# Patient Record
Sex: Female | Born: 1950
Health system: Southern US, Community
[De-identification: ages and names within clinical notes are randomized; demographics above are authoritative.]

---

## 1998-04-17 ENCOUNTER — Ambulatory Visit (HOSPITAL_BASED_OUTPATIENT_CLINIC_OR_DEPARTMENT_OTHER): Admission: RE | Admit: 1998-04-17 | Discharge: 1998-04-17 | Payer: Self-pay | Admitting: Orthopedic Surgery

## 2006-02-11 ENCOUNTER — Encounter: Admission: RE | Admit: 2006-02-11 | Discharge: 2006-02-11 | Payer: Self-pay | Admitting: Internal Medicine

## 2006-02-25 ENCOUNTER — Other Ambulatory Visit: Admission: RE | Admit: 2006-02-25 | Discharge: 2006-02-25 | Payer: Self-pay | Admitting: Internal Medicine

## 2006-09-17 ENCOUNTER — Encounter: Admission: RE | Admit: 2006-09-17 | Discharge: 2006-09-17 | Payer: Self-pay | Admitting: Internal Medicine

## 2007-09-29 ENCOUNTER — Other Ambulatory Visit: Admission: RE | Admit: 2007-09-29 | Discharge: 2007-09-29 | Payer: Self-pay | Admitting: Internal Medicine

## 2007-10-28 ENCOUNTER — Encounter: Admission: RE | Admit: 2007-10-28 | Discharge: 2007-10-28 | Payer: Self-pay | Admitting: Internal Medicine

## 2008-10-16 ENCOUNTER — Other Ambulatory Visit: Admission: RE | Admit: 2008-10-16 | Discharge: 2008-10-16 | Payer: Self-pay | Admitting: Internal Medicine

## 2009-07-05 ENCOUNTER — Encounter: Admission: RE | Admit: 2009-07-05 | Discharge: 2009-07-05 | Payer: Self-pay | Admitting: Internal Medicine

## 2009-07-11 ENCOUNTER — Encounter: Admission: RE | Admit: 2009-07-11 | Discharge: 2009-07-11 | Payer: Self-pay | Admitting: Internal Medicine

## 2009-10-17 ENCOUNTER — Other Ambulatory Visit: Admission: RE | Admit: 2009-10-17 | Discharge: 2009-10-17 | Payer: Self-pay | Admitting: Internal Medicine

## 2010-10-22 ENCOUNTER — Other Ambulatory Visit
Admission: RE | Admit: 2010-10-22 | Discharge: 2010-10-22 | Payer: Self-pay | Source: Home / Self Care | Admitting: Internal Medicine

## 2011-10-14 ENCOUNTER — Other Ambulatory Visit: Payer: Self-pay | Admitting: Internal Medicine

## 2011-10-14 DIAGNOSIS — Z1231 Encounter for screening mammogram for malignant neoplasm of breast: Secondary | ICD-10-CM

## 2011-10-27 ENCOUNTER — Ambulatory Visit
Admission: RE | Admit: 2011-10-27 | Discharge: 2011-10-27 | Disposition: A | Discharge status: Home / Self Care | Payer: BC Managed Care – PPO | Source: Ambulatory Visit | Attending: Internal Medicine | Admitting: Internal Medicine

## 2011-10-27 DIAGNOSIS — Z1231 Encounter for screening mammogram for malignant neoplasm of breast: Secondary | ICD-10-CM

## 2012-03-01 ENCOUNTER — Other Ambulatory Visit: Payer: Self-pay | Admitting: Dermatology

## 2012-05-30 ENCOUNTER — Other Ambulatory Visit: Payer: Self-pay | Admitting: Dermatology

## 2013-11-06 ENCOUNTER — Other Ambulatory Visit: Payer: Self-pay

## 2013-11-06 DIAGNOSIS — Z1231 Encounter for screening mammogram for malignant neoplasm of breast: Secondary | ICD-10-CM

## 2013-11-21 ENCOUNTER — Other Ambulatory Visit: Payer: Self-pay | Admitting: Internal Medicine

## 2013-11-21 ENCOUNTER — Other Ambulatory Visit (HOSPITAL_COMMUNITY)
Admission: RE | Admit: 2013-11-21 | Discharge: 2013-11-21 | Disposition: A | Discharge status: Home / Self Care | Payer: 59 | Source: Ambulatory Visit | Attending: Internal Medicine | Admitting: Internal Medicine

## 2013-11-21 DIAGNOSIS — Z01419 Encounter for gynecological examination (general) (routine) without abnormal findings: Secondary | ICD-10-CM | POA: Insufficient documentation

## 2013-11-21 DIAGNOSIS — Z1151 Encounter for screening for human papillomavirus (HPV): Secondary | ICD-10-CM | POA: Insufficient documentation

## 2013-12-05 ENCOUNTER — Ambulatory Visit: Payer: BC Managed Care – PPO

## 2013-12-14 ENCOUNTER — Ambulatory Visit
Admission: RE | Admit: 2013-12-14 | Discharge: 2013-12-14 | Disposition: A | Discharge status: Home / Self Care | Payer: 59 | Source: Ambulatory Visit

## 2013-12-14 DIAGNOSIS — Z1231 Encounter for screening mammogram for malignant neoplasm of breast: Secondary | ICD-10-CM

## 2015-12-26 ENCOUNTER — Other Ambulatory Visit: Payer: Self-pay

## 2015-12-26 DIAGNOSIS — Z1231 Encounter for screening mammogram for malignant neoplasm of breast: Secondary | ICD-10-CM

## 2016-01-21 ENCOUNTER — Ambulatory Visit
Admission: RE | Admit: 2016-01-21 | Discharge: 2016-01-21 | Disposition: A | Discharge status: Home / Self Care | Payer: 59 | Source: Ambulatory Visit

## 2016-01-21 DIAGNOSIS — Z1231 Encounter for screening mammogram for malignant neoplasm of breast: Secondary | ICD-10-CM

## 2016-11-12 DIAGNOSIS — R748 Abnormal levels of other serum enzymes: Secondary | ICD-10-CM | POA: Diagnosis not present

## 2016-11-12 DIAGNOSIS — I10 Essential (primary) hypertension: Secondary | ICD-10-CM | POA: Diagnosis not present

## 2016-11-12 DIAGNOSIS — M8580 Other specified disorders of bone density and structure, unspecified site: Secondary | ICD-10-CM | POA: Diagnosis not present

## 2016-11-27 DIAGNOSIS — R748 Abnormal levels of other serum enzymes: Secondary | ICD-10-CM | POA: Diagnosis not present

## 2017-02-27 ENCOUNTER — Ambulatory Visit (INDEPENDENT_AMBULATORY_CARE_PROVIDER_SITE_OTHER): Payer: PPO | Admitting: Family Medicine

## 2017-02-27 VITALS — BP 166/88 | HR 86 | Temp 99.3°F | Resp 16 | Wt 116.8 lb

## 2017-02-27 DIAGNOSIS — S61552A Open bite of left wrist, initial encounter: Secondary | ICD-10-CM

## 2017-02-27 DIAGNOSIS — S61452A Open bite of left hand, initial encounter: Secondary | ICD-10-CM

## 2017-02-27 DIAGNOSIS — W5501XA Bitten by cat, initial encounter: Secondary | ICD-10-CM | POA: Diagnosis not present

## 2017-02-27 DIAGNOSIS — Z23 Encounter for immunization: Secondary | ICD-10-CM

## 2017-02-27 MED ORDER — AMOXICILLIN-POT CLAVULANATE 875-125 MG PO TABS: 1.0000 | ORAL_TABLET | Freq: Two times a day (BID) | ORAL | 0 refills | Status: AC

## 2017-02-27 NOTE — Progress Notes (Signed)
All wounds cleaned with soap and water in office

## 2017-02-27 NOTE — Patient Instructions (Addendum)
Keep wounds clean and covered until they are well questions or healed. Be especially cautious with changing the dressing on the place on your thumb. The oral antibiotics are the important things, and use of topical antibiotic ointments is optional depending on whether they are looking inflamed or are sticking to the bandage too much.  Take Augmentin 875 one twice daily with breakfast and supper. Take first pill now.  If red streaks are going up the arm, significant redness is developing, fevers, pain, or swollen glands in the armpit, problem please come back for a recheck or go to the emergency room if necessary.    IF you received an x-ray today, you will receive an invoice from [facility]. Please contact [facility] at [phone] with questions or concerns regarding your invoice.   IF you received labwork today, you will receive an invoice from [facility]. Please contact [facility] at [phone] with questions or concerns regarding your invoice.   Our billing staff will not be able to assist you with questions regarding bills from these companies.  You will be contacted with the lab results as soon as they are available. The fastest way to get your results is to activate your My Chart account. Instructions are located on the last page of this paperwork. If you have not heard from [place] regarding the results in 2 weeks, please contact this office.

## 2017-02-27 NOTE — Progress Notes (Signed)
Patient ID: TILLA WILBORN, female    DOB: March 15, 1951  Age: 66 y.o. MRN: [id number]  Chief Complaint  Patient presents with  . Animal Bite    today neighbors cate/multi scratch and bite to arms,left thumb and leg    Subjective:  Pleasant lady 41 years old who was helping take care of her neighbor's cat while the labor was out of town. The cat got out the door and the patient picked it up to cause it back in the house, with which the cat scratched and bit her.   Current allergies, medications, problem list, past/family and social histories reviewed.  Objective:  BP (!) 166/88 (Cuff Size: Normal)   Pulse 86   Temp 99.3 F (37.4 C) (Oral)   Resp 16   Wt 116 lb 12.8 oz (53 kg)   SpO2 98%  Most of the day injuries are to the left arm. She does have a small place on her right wrist and on the left leg there is also a scratch. The forearm has scratches and a couple of apparent tooth marks. The left index finger and thumb have tooth marks. These involve and washing her clean. The left thumb has a little flap of skin but appears to close well.    Assessment & Plan:   Assessment: 1. Cat bite of multiple sites of left hand and wrist, initial encounter       Plan: Wounds dressed.  Orders Placed This Encounter  Procedures  . Tdap vaccine greater than or equal to 7yo IM    Meds ordered this encounter  Medications  . olmesartan (BENICAR) 20 MG tablet    Sig: Take 20 mg by mouth daily.  Marland Kitchen amoxicillin-clavulanate (AUGMENTIN) 875-125 MG tablet    Sig: Take 1 tablet by mouth 2 (two) times daily.    Dispense:  20 tablet    Refill:  0         Patient Instructions   Keep wounds clean and covered until they are well questions or healed. Be especially cautious with changing the dressing on the place on your thumb. The oral antibiotics are the important things, and use of topical antibiotic ointments is optional depending on whether they are looking inflamed or are sticking to the  bandage too much.  Take Augmentin 875 one twice daily with breakfast and supper. Take first pill now.  If red streaks are going up the arm, significant redness is developing, fevers, pain, or swollen glands in the armpit, problem please come back for a recheck or go to the emergency room if necessary.    IF you received an x-ray today, you will receive an invoice from [facility]. Please contact [facility] at [phone] with questions or concerns regarding your invoice.   IF you received labwork today, you will receive an invoice from [facility]. Please contact [facility] at [phone] with questions or concerns regarding your invoice.   Our billing staff will not be able to assist you with questions regarding bills from these companies.  You will be contacted with the lab results as soon as they are available. The fastest way to get your results is to activate your My Chart account. Instructions are located on the last page of this paperwork. If you have not heard from [place] regarding the results in 2 weeks, please contact this office.        Return if symptoms worsen or fail to improve.   Zonnique Norkus, MD 02/27/2017

## 2017-03-01 ENCOUNTER — Ambulatory Visit: Payer: PPO | Admitting: Physician Assistant

## 2017-03-25 DIAGNOSIS — Z0000 Encounter for general adult medical examination without abnormal findings: Secondary | ICD-10-CM | POA: Diagnosis not present

## 2017-03-25 DIAGNOSIS — I10 Essential (primary) hypertension: Secondary | ICD-10-CM | POA: Diagnosis not present

## 2017-03-25 DIAGNOSIS — M8580 Other specified disorders of bone density and structure, unspecified site: Secondary | ICD-10-CM | POA: Diagnosis not present

## 2017-03-25 DIAGNOSIS — Z23 Encounter for immunization: Secondary | ICD-10-CM | POA: Diagnosis not present

## 2017-07-28 ENCOUNTER — Other Ambulatory Visit: Payer: Self-pay | Admitting: Pharmacist

## 2017-07-28 NOTE — Patient Outreach (Signed)
Incoming call from Sara Hanson in response to the [facility] Medication Adherence Campaign. Speak with patient. HIPAA identifiers verified and verbal consent received.  Ms. Rhines reports that she takes her olmesartan once daily as directed. Denies any missed doses or barriers to adherence. Reports that earlier in the year, prior to receiving the medication as a generic, she received several samples of Benicar and took that rather than having it refilled for cost savings.  Patient denies any medication questions/concerns at this time. Provide her with my phone number.  Harlow Asa, PharmD, [facility] [phone]

## 2017-11-19 DIAGNOSIS — Z85828 Personal history of other malignant neoplasm of skin: Secondary | ICD-10-CM | POA: Diagnosis not present

## 2017-11-19 DIAGNOSIS — D2272 Melanocytic nevi of left lower limb, including hip: Secondary | ICD-10-CM | POA: Diagnosis not present

## 2017-11-19 DIAGNOSIS — L570 Actinic keratosis: Secondary | ICD-10-CM | POA: Diagnosis not present

## 2017-11-19 DIAGNOSIS — L821 Other seborrheic keratosis: Secondary | ICD-10-CM | POA: Diagnosis not present

## 2018-08-25 DIAGNOSIS — Z23 Encounter for immunization: Secondary | ICD-10-CM | POA: Diagnosis not present

## 2018-08-25 DIAGNOSIS — R748 Abnormal levels of other serum enzymes: Secondary | ICD-10-CM | POA: Diagnosis not present

## 2018-08-25 DIAGNOSIS — I10 Essential (primary) hypertension: Secondary | ICD-10-CM | POA: Diagnosis not present

## 2018-09-02 DIAGNOSIS — R748 Abnormal levels of other serum enzymes: Secondary | ICD-10-CM | POA: Diagnosis not present

## 2018-11-17 DIAGNOSIS — F419 Anxiety disorder, unspecified: Secondary | ICD-10-CM | POA: Diagnosis not present

## 2018-11-17 DIAGNOSIS — F332 Major depressive disorder, recurrent severe without psychotic features: Secondary | ICD-10-CM | POA: Diagnosis not present

## 2019-01-31 DIAGNOSIS — M8580 Other specified disorders of bone density and structure, unspecified site: Secondary | ICD-10-CM | POA: Diagnosis not present

## 2019-01-31 DIAGNOSIS — Z0000 Encounter for general adult medical examination without abnormal findings: Secondary | ICD-10-CM | POA: Diagnosis not present

## 2019-01-31 DIAGNOSIS — Z1389 Encounter for screening for other disorder: Secondary | ICD-10-CM | POA: Diagnosis not present

## 2019-01-31 DIAGNOSIS — I10 Essential (primary) hypertension: Secondary | ICD-10-CM | POA: Diagnosis not present

## 2019-01-31 DIAGNOSIS — N183 Chronic kidney disease, stage 3 (moderate): Secondary | ICD-10-CM | POA: Diagnosis not present

## 2019-06-26 DIAGNOSIS — L821 Other seborrheic keratosis: Secondary | ICD-10-CM | POA: Diagnosis not present

## 2019-06-26 DIAGNOSIS — D2272 Melanocytic nevi of left lower limb, including hip: Secondary | ICD-10-CM | POA: Diagnosis not present

## 2019-06-26 DIAGNOSIS — L814 Other melanin hyperpigmentation: Secondary | ICD-10-CM | POA: Diagnosis not present

## 2019-06-26 DIAGNOSIS — L720 Epidermal cyst: Secondary | ICD-10-CM | POA: Diagnosis not present

## 2019-06-26 DIAGNOSIS — L570 Actinic keratosis: Secondary | ICD-10-CM | POA: Diagnosis not present

## 2019-06-26 DIAGNOSIS — D225 Melanocytic nevi of trunk: Secondary | ICD-10-CM | POA: Diagnosis not present

## 2019-06-26 DIAGNOSIS — D485 Neoplasm of uncertain behavior of skin: Secondary | ICD-10-CM | POA: Diagnosis not present

## 2019-06-26 DIAGNOSIS — Z85828 Personal history of other malignant neoplasm of skin: Secondary | ICD-10-CM | POA: Diagnosis not present

## 2019-08-02 ENCOUNTER — Ambulatory Visit (INDEPENDENT_AMBULATORY_CARE_PROVIDER_SITE_OTHER): Payer: PPO | Admitting: Psychology

## 2019-08-02 DIAGNOSIS — F4323 Adjustment disorder with mixed anxiety and depressed mood: Secondary | ICD-10-CM | POA: Diagnosis not present

## 2019-08-16 ENCOUNTER — Ambulatory Visit (INDEPENDENT_AMBULATORY_CARE_PROVIDER_SITE_OTHER): Payer: PPO | Admitting: Psychology

## 2019-08-16 DIAGNOSIS — F4323 Adjustment disorder with mixed anxiety and depressed mood: Secondary | ICD-10-CM

## 2019-09-04 ENCOUNTER — Ambulatory Visit: Payer: PPO | Admitting: Psychology

## 2019-09-13 ENCOUNTER — Other Ambulatory Visit: Payer: Self-pay | Admitting: Internal Medicine

## 2019-09-13 DIAGNOSIS — Z1231 Encounter for screening mammogram for malignant neoplasm of breast: Secondary | ICD-10-CM

## 2019-09-18 ENCOUNTER — Ambulatory Visit (INDEPENDENT_AMBULATORY_CARE_PROVIDER_SITE_OTHER): Payer: PPO | Admitting: Psychology

## 2019-09-18 DIAGNOSIS — F4323 Adjustment disorder with mixed anxiety and depressed mood: Secondary | ICD-10-CM | POA: Diagnosis not present

## 2019-09-23 ENCOUNTER — Ambulatory Visit
Admission: RE | Admit: 2019-09-23 | Discharge: 2019-09-23 | Disposition: A | Discharge status: Home / Self Care | Payer: PPO | Source: Ambulatory Visit | Attending: Internal Medicine | Admitting: Internal Medicine

## 2019-09-23 ENCOUNTER — Other Ambulatory Visit: Payer: Self-pay

## 2019-09-23 DIAGNOSIS — Z1231 Encounter for screening mammogram for malignant neoplasm of breast: Secondary | ICD-10-CM | POA: Diagnosis not present

## 2019-10-16 ENCOUNTER — Ambulatory Visit (INDEPENDENT_AMBULATORY_CARE_PROVIDER_SITE_OTHER): Payer: PPO | Admitting: Psychology

## 2019-10-16 DIAGNOSIS — F4323 Adjustment disorder with mixed anxiety and depressed mood: Secondary | ICD-10-CM

## 2020-02-01 DIAGNOSIS — N1831 Chronic kidney disease, stage 3a: Secondary | ICD-10-CM | POA: Diagnosis not present

## 2020-02-01 DIAGNOSIS — Z0000 Encounter for general adult medical examination without abnormal findings: Secondary | ICD-10-CM | POA: Diagnosis not present

## 2020-02-01 DIAGNOSIS — Z1211 Encounter for screening for malignant neoplasm of colon: Secondary | ICD-10-CM | POA: Diagnosis not present

## 2020-02-01 DIAGNOSIS — M8580 Other specified disorders of bone density and structure, unspecified site: Secondary | ICD-10-CM | POA: Diagnosis not present

## 2020-02-01 DIAGNOSIS — I10 Essential (primary) hypertension: Secondary | ICD-10-CM | POA: Diagnosis not present

## 2020-02-01 DIAGNOSIS — Z1389 Encounter for screening for other disorder: Secondary | ICD-10-CM | POA: Diagnosis not present

## 2020-02-13 ENCOUNTER — Other Ambulatory Visit: Payer: Self-pay | Admitting: Internal Medicine

## 2020-02-13 DIAGNOSIS — M8580 Other specified disorders of bone density and structure, unspecified site: Secondary | ICD-10-CM

## 2020-02-19 DIAGNOSIS — M7502 Adhesive capsulitis of left shoulder: Secondary | ICD-10-CM | POA: Diagnosis not present

## 2020-02-19 DIAGNOSIS — M25512 Pain in left shoulder: Secondary | ICD-10-CM | POA: Diagnosis not present

## 2020-03-04 DIAGNOSIS — M25612 Stiffness of left shoulder, not elsewhere classified: Secondary | ICD-10-CM | POA: Diagnosis not present

## 2020-03-04 DIAGNOSIS — M25512 Pain in left shoulder: Secondary | ICD-10-CM | POA: Diagnosis not present

## 2020-03-12 DIAGNOSIS — M25612 Stiffness of left shoulder, not elsewhere classified: Secondary | ICD-10-CM | POA: Diagnosis not present

## 2020-03-12 DIAGNOSIS — M25512 Pain in left shoulder: Secondary | ICD-10-CM | POA: Diagnosis not present

## 2020-03-15 DIAGNOSIS — M25612 Stiffness of left shoulder, not elsewhere classified: Secondary | ICD-10-CM | POA: Diagnosis not present

## 2020-03-15 DIAGNOSIS — M25512 Pain in left shoulder: Secondary | ICD-10-CM | POA: Diagnosis not present

## 2020-03-18 DIAGNOSIS — M25512 Pain in left shoulder: Secondary | ICD-10-CM | POA: Diagnosis not present

## 2020-03-18 DIAGNOSIS — M25612 Stiffness of left shoulder, not elsewhere classified: Secondary | ICD-10-CM | POA: Diagnosis not present

## 2020-03-29 DIAGNOSIS — M25612 Stiffness of left shoulder, not elsewhere classified: Secondary | ICD-10-CM | POA: Diagnosis not present

## 2020-03-29 DIAGNOSIS — M25512 Pain in left shoulder: Secondary | ICD-10-CM | POA: Diagnosis not present

## 2020-05-29 ENCOUNTER — Other Ambulatory Visit: Payer: Self-pay

## 2020-05-29 ENCOUNTER — Ambulatory Visit
Admission: RE | Admit: 2020-05-29 | Discharge: 2020-05-29 | Disposition: A | Discharge status: Home / Self Care | Payer: PPO | Source: Ambulatory Visit | Attending: Internal Medicine | Admitting: Internal Medicine

## 2020-05-29 DIAGNOSIS — M8589 Other specified disorders of bone density and structure, multiple sites: Secondary | ICD-10-CM | POA: Diagnosis not present

## 2020-05-29 DIAGNOSIS — M8580 Other specified disorders of bone density and structure, unspecified site: Secondary | ICD-10-CM

## 2020-05-29 DIAGNOSIS — Z780 Asymptomatic menopausal state: Secondary | ICD-10-CM | POA: Diagnosis not present

## 2020-06-25 DIAGNOSIS — C44311 Basal cell carcinoma of skin of nose: Secondary | ICD-10-CM | POA: Diagnosis not present

## 2020-06-25 DIAGNOSIS — D2272 Melanocytic nevi of left lower limb, including hip: Secondary | ICD-10-CM | POA: Diagnosis not present

## 2020-06-25 DIAGNOSIS — D225 Melanocytic nevi of trunk: Secondary | ICD-10-CM | POA: Diagnosis not present

## 2020-06-25 DIAGNOSIS — L821 Other seborrheic keratosis: Secondary | ICD-10-CM | POA: Diagnosis not present

## 2020-06-25 DIAGNOSIS — D224 Melanocytic nevi of scalp and neck: Secondary | ICD-10-CM | POA: Diagnosis not present

## 2020-06-25 DIAGNOSIS — L570 Actinic keratosis: Secondary | ICD-10-CM | POA: Diagnosis not present

## 2020-06-25 DIAGNOSIS — L814 Other melanin hyperpigmentation: Secondary | ICD-10-CM | POA: Diagnosis not present

## 2020-06-25 DIAGNOSIS — Z85828 Personal history of other malignant neoplasm of skin: Secondary | ICD-10-CM | POA: Diagnosis not present

## 2020-07-02 DIAGNOSIS — H2513 Age-related nuclear cataract, bilateral: Secondary | ICD-10-CM | POA: Diagnosis not present

## 2020-07-02 DIAGNOSIS — H524 Presbyopia: Secondary | ICD-10-CM | POA: Diagnosis not present

## 2020-07-02 DIAGNOSIS — H04123 Dry eye syndrome of bilateral lacrimal glands: Secondary | ICD-10-CM | POA: Diagnosis not present

## 2020-07-31 DIAGNOSIS — C44311 Basal cell carcinoma of skin of nose: Secondary | ICD-10-CM | POA: Diagnosis not present

## 2020-07-31 DIAGNOSIS — Z85828 Personal history of other malignant neoplasm of skin: Secondary | ICD-10-CM | POA: Diagnosis not present

## 2020-08-22 IMAGING — MG DIGITAL SCREENING BILAT W/ TOMO W/ CAD
6 series · 6 of 18 positions shown · non-contrast
Comparison: Previous exam(s).

CLINICAL DATA: Screening.

EXAM:
DIGITAL SCREENING BILATERAL MAMMOGRAM WITH TOMO AND CAD

[R MLO synth-2D]
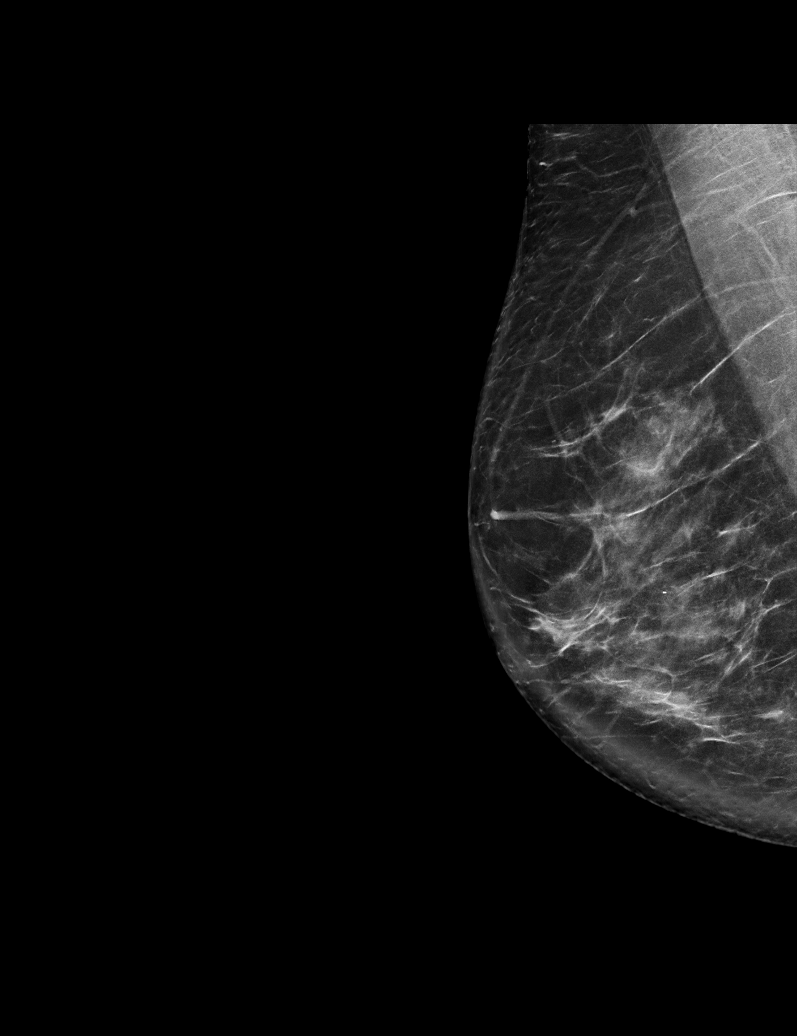

[L MLO synth-2D]
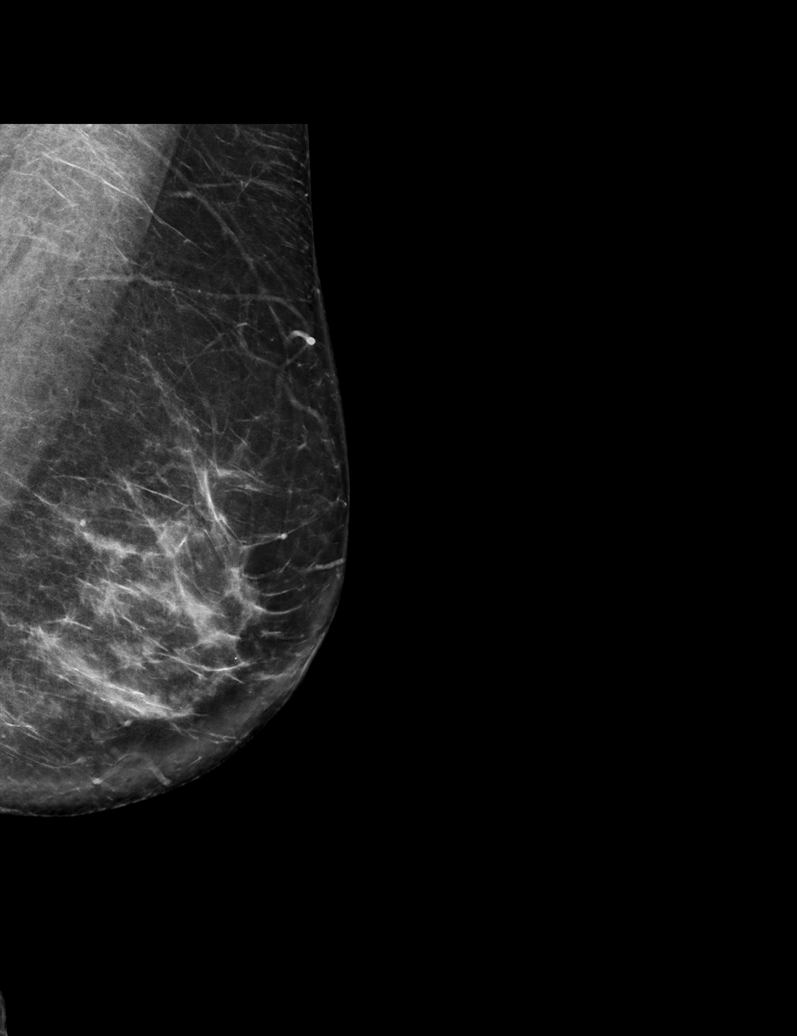

[L CC synth-2D]
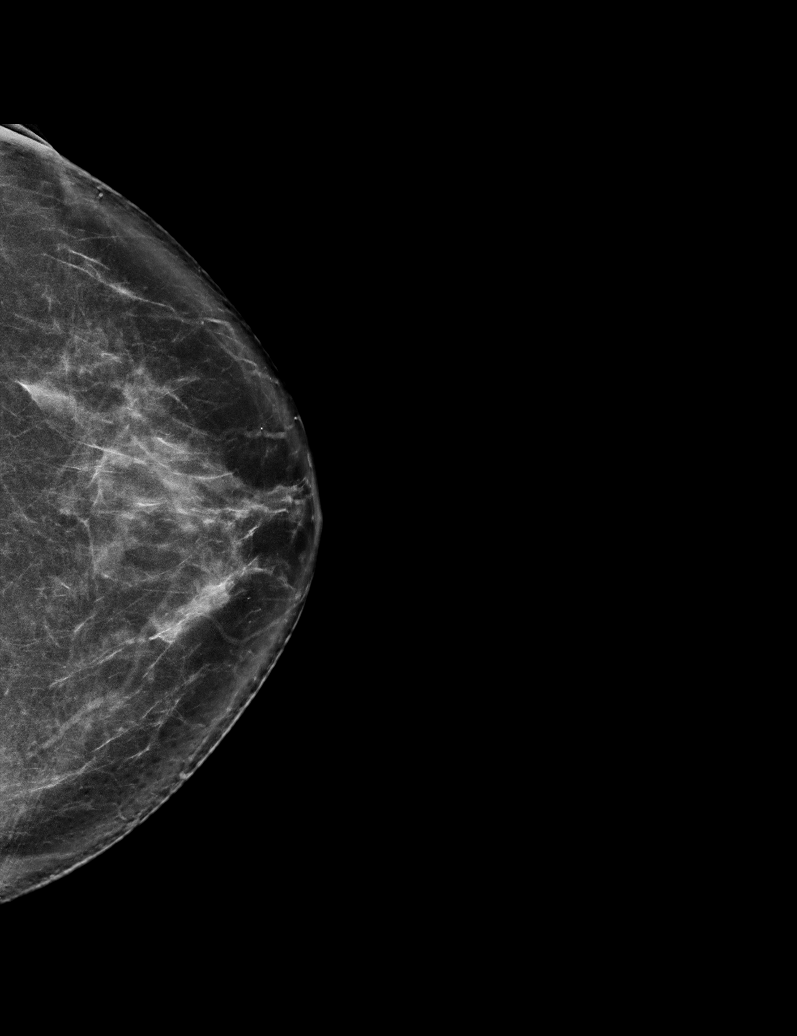

[R MLO tomo · tomo slice 41/82.0]
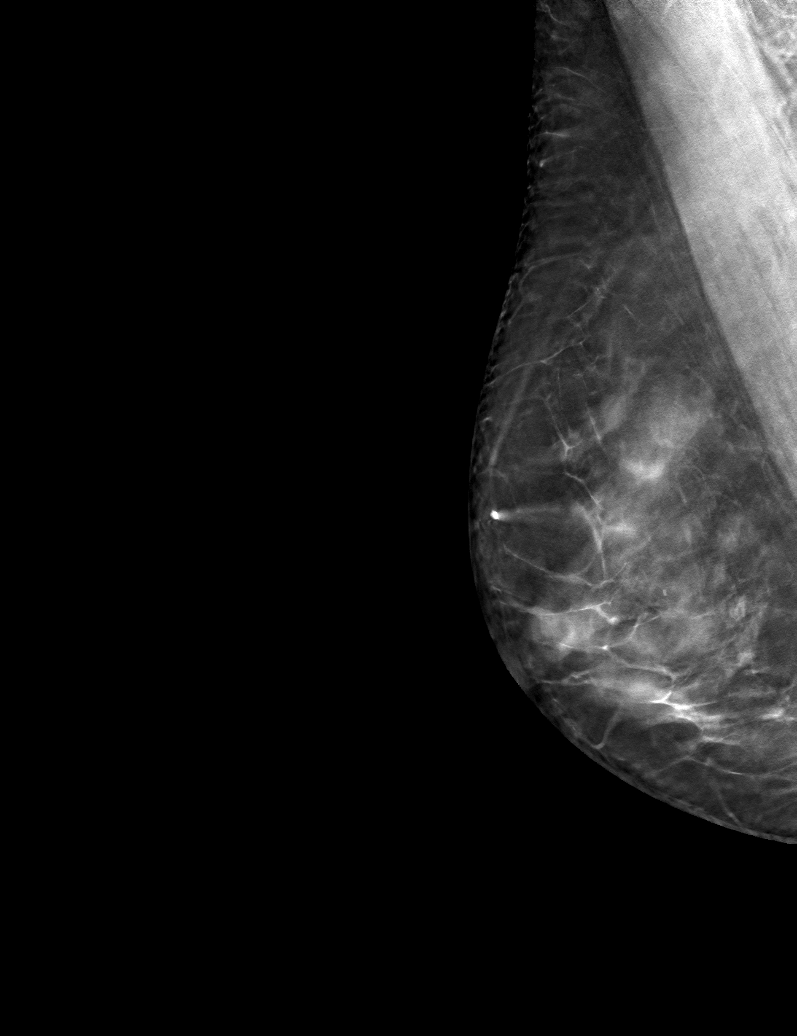

[L CC tomo · tomo slice 37/73.0]
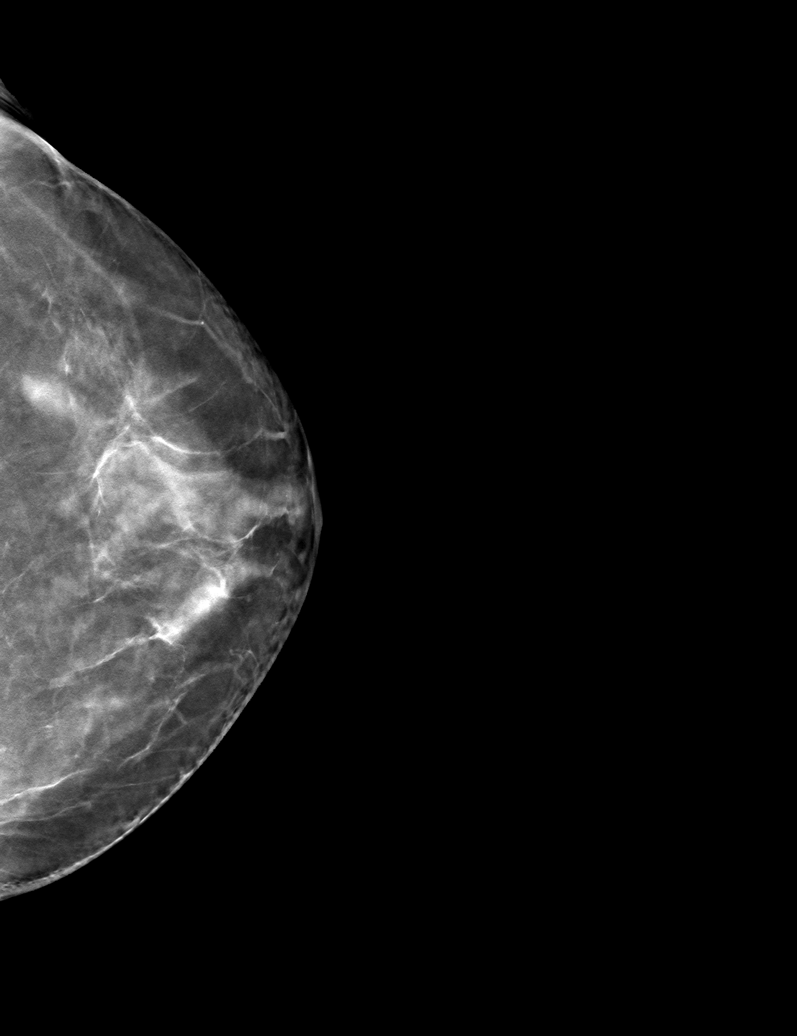

[L MLO tomo · tomo slice 39/77.0]
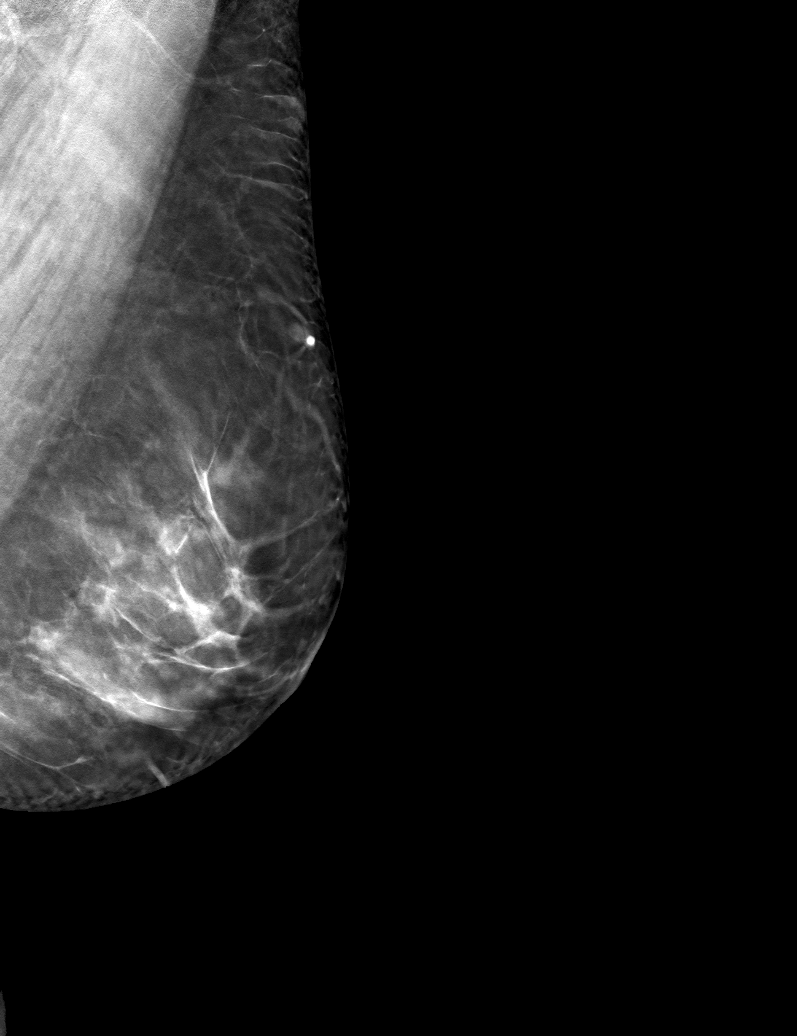

[6 of 18 positions shown; findings below may reference images not displayed]

ACR Breast Density Category c: The breast tissue is heterogeneously
dense, which may obscure small masses.
FINDINGS: There are no findings suspicious for malignancy. Images were
processed with CAD.
IMPRESSION: No mammographic evidence of malignancy. A result letter of this
screening mammogram will be mailed directly to the patient.

RECOMMENDATION:
Screening mammogram in one year. (Code:[id number])

BI-RADS CATEGORY  1: Negative.

## 2020-12-31 DIAGNOSIS — L820 Inflamed seborrheic keratosis: Secondary | ICD-10-CM | POA: Diagnosis not present

## 2020-12-31 DIAGNOSIS — L821 Other seborrheic keratosis: Secondary | ICD-10-CM | POA: Diagnosis not present

## 2020-12-31 DIAGNOSIS — L814 Other melanin hyperpigmentation: Secondary | ICD-10-CM | POA: Diagnosis not present

## 2020-12-31 DIAGNOSIS — D2272 Melanocytic nevi of left lower limb, including hip: Secondary | ICD-10-CM | POA: Diagnosis not present

## 2020-12-31 DIAGNOSIS — D225 Melanocytic nevi of trunk: Secondary | ICD-10-CM | POA: Diagnosis not present

## 2020-12-31 DIAGNOSIS — Z85828 Personal history of other malignant neoplasm of skin: Secondary | ICD-10-CM | POA: Diagnosis not present

## 2021-03-05 DIAGNOSIS — Z0000 Encounter for general adult medical examination without abnormal findings: Secondary | ICD-10-CM | POA: Diagnosis not present

## 2021-03-05 DIAGNOSIS — M8580 Other specified disorders of bone density and structure, unspecified site: Secondary | ICD-10-CM | POA: Diagnosis not present

## 2021-03-05 DIAGNOSIS — I10 Essential (primary) hypertension: Secondary | ICD-10-CM | POA: Diagnosis not present

## 2021-03-05 DIAGNOSIS — N183 Chronic kidney disease, stage 3 unspecified: Secondary | ICD-10-CM | POA: Diagnosis not present

## 2021-03-05 DIAGNOSIS — E7800 Pure hypercholesterolemia, unspecified: Secondary | ICD-10-CM | POA: Diagnosis not present

## 2021-03-05 DIAGNOSIS — N1831 Chronic kidney disease, stage 3a: Secondary | ICD-10-CM | POA: Diagnosis not present

## 2021-05-08 ENCOUNTER — Other Ambulatory Visit: Payer: Self-pay | Admitting: Internal Medicine

## 2021-05-08 ENCOUNTER — Ambulatory Visit
Admission: RE | Admit: 2021-05-08 | Discharge: 2021-05-08 | Disposition: A | Discharge status: Home / Self Care | Payer: PPO | Source: Ambulatory Visit | Attending: Internal Medicine | Admitting: Internal Medicine

## 2021-05-08 ENCOUNTER — Other Ambulatory Visit: Payer: Self-pay

## 2021-05-08 DIAGNOSIS — G4485 Primary stabbing headache: Secondary | ICD-10-CM

## 2021-05-08 DIAGNOSIS — R519 Headache, unspecified: Secondary | ICD-10-CM | POA: Diagnosis not present

## 2021-05-08 DIAGNOSIS — R200 Anesthesia of skin: Secondary | ICD-10-CM | POA: Diagnosis not present

## 2021-05-08 DIAGNOSIS — R202 Paresthesia of skin: Secondary | ICD-10-CM | POA: Diagnosis not present

## 2021-06-04 DIAGNOSIS — D2272 Melanocytic nevi of left lower limb, including hip: Secondary | ICD-10-CM | POA: Diagnosis not present

## 2021-06-04 DIAGNOSIS — L821 Other seborrheic keratosis: Secondary | ICD-10-CM | POA: Diagnosis not present

## 2021-06-04 DIAGNOSIS — L814 Other melanin hyperpigmentation: Secondary | ICD-10-CM | POA: Diagnosis not present

## 2021-06-04 DIAGNOSIS — Z85828 Personal history of other malignant neoplasm of skin: Secondary | ICD-10-CM | POA: Diagnosis not present

## 2021-06-04 DIAGNOSIS — L720 Epidermal cyst: Secondary | ICD-10-CM | POA: Diagnosis not present

## 2021-07-08 DIAGNOSIS — H524 Presbyopia: Secondary | ICD-10-CM | POA: Diagnosis not present

## 2021-07-08 DIAGNOSIS — H04123 Dry eye syndrome of bilateral lacrimal glands: Secondary | ICD-10-CM | POA: Diagnosis not present

## 2021-07-08 DIAGNOSIS — H2513 Age-related nuclear cataract, bilateral: Secondary | ICD-10-CM | POA: Diagnosis not present

## 2022-02-02 DIAGNOSIS — L821 Other seborrheic keratosis: Secondary | ICD-10-CM | POA: Diagnosis not present

## 2022-02-02 DIAGNOSIS — L814 Other melanin hyperpigmentation: Secondary | ICD-10-CM | POA: Diagnosis not present

## 2022-02-02 DIAGNOSIS — D225 Melanocytic nevi of trunk: Secondary | ICD-10-CM | POA: Diagnosis not present

## 2022-02-02 DIAGNOSIS — Z85828 Personal history of other malignant neoplasm of skin: Secondary | ICD-10-CM | POA: Diagnosis not present

## 2022-02-02 DIAGNOSIS — L820 Inflamed seborrheic keratosis: Secondary | ICD-10-CM | POA: Diagnosis not present

## 2022-02-02 DIAGNOSIS — D2272 Melanocytic nevi of left lower limb, including hip: Secondary | ICD-10-CM | POA: Diagnosis not present

## 2022-03-13 ENCOUNTER — Other Ambulatory Visit: Payer: Self-pay | Admitting: Internal Medicine

## 2022-03-13 DIAGNOSIS — M8580 Other specified disorders of bone density and structure, unspecified site: Secondary | ICD-10-CM | POA: Diagnosis not present

## 2022-03-13 DIAGNOSIS — Z1231 Encounter for screening mammogram for malignant neoplasm of breast: Secondary | ICD-10-CM

## 2022-03-13 DIAGNOSIS — E7800 Pure hypercholesterolemia, unspecified: Secondary | ICD-10-CM | POA: Diagnosis not present

## 2022-03-13 DIAGNOSIS — E559 Vitamin D deficiency, unspecified: Secondary | ICD-10-CM | POA: Diagnosis not present

## 2022-03-13 DIAGNOSIS — Z1331 Encounter for screening for depression: Secondary | ICD-10-CM | POA: Diagnosis not present

## 2022-03-13 DIAGNOSIS — Z1211 Encounter for screening for malignant neoplasm of colon: Secondary | ICD-10-CM | POA: Diagnosis not present

## 2022-03-13 DIAGNOSIS — I10 Essential (primary) hypertension: Secondary | ICD-10-CM | POA: Diagnosis not present

## 2022-03-13 DIAGNOSIS — R739 Hyperglycemia, unspecified: Secondary | ICD-10-CM | POA: Diagnosis not present

## 2022-03-13 DIAGNOSIS — N1831 Chronic kidney disease, stage 3a: Secondary | ICD-10-CM | POA: Diagnosis not present

## 2022-03-13 DIAGNOSIS — Z0000 Encounter for general adult medical examination without abnormal findings: Secondary | ICD-10-CM | POA: Diagnosis not present

## 2022-03-13 DIAGNOSIS — R5381 Other malaise: Secondary | ICD-10-CM

## 2022-03-18 ENCOUNTER — Other Ambulatory Visit: Payer: Self-pay | Admitting: Internal Medicine

## 2022-03-18 DIAGNOSIS — M8580 Other specified disorders of bone density and structure, unspecified site: Secondary | ICD-10-CM

## 2022-03-19 DIAGNOSIS — L570 Actinic keratosis: Secondary | ICD-10-CM | POA: Diagnosis not present

## 2022-03-19 DIAGNOSIS — Z85828 Personal history of other malignant neoplasm of skin: Secondary | ICD-10-CM | POA: Diagnosis not present

## 2022-04-17 ENCOUNTER — Ambulatory Visit
Admission: RE | Admit: 2022-04-17 | Discharge: 2022-04-17 | Disposition: A | Discharge status: Home / Self Care | Payer: PPO | Source: Ambulatory Visit | Attending: Internal Medicine | Admitting: Internal Medicine

## 2022-04-17 DIAGNOSIS — Z1231 Encounter for screening mammogram for malignant neoplasm of breast: Secondary | ICD-10-CM

## 2022-05-19 DIAGNOSIS — R7301 Impaired fasting glucose: Secondary | ICD-10-CM | POA: Diagnosis not present

## 2022-05-19 DIAGNOSIS — E7800 Pure hypercholesterolemia, unspecified: Secondary | ICD-10-CM | POA: Diagnosis not present

## 2022-05-21 ENCOUNTER — Other Ambulatory Visit: Payer: Self-pay | Admitting: Internal Medicine

## 2022-05-21 DIAGNOSIS — E7800 Pure hypercholesterolemia, unspecified: Secondary | ICD-10-CM

## 2022-05-21 DIAGNOSIS — Z8249 Family history of ischemic heart disease and other diseases of the circulatory system: Secondary | ICD-10-CM

## 2022-05-29 ENCOUNTER — Ambulatory Visit
Admission: RE | Admit: 2022-05-29 | Discharge: 2022-05-29 | Disposition: A | Discharge status: Home / Self Care | Payer: No Typology Code available for payment source | Source: Ambulatory Visit | Attending: Internal Medicine | Admitting: Internal Medicine

## 2022-05-29 DIAGNOSIS — Z8249 Family history of ischemic heart disease and other diseases of the circulatory system: Secondary | ICD-10-CM

## 2022-05-29 DIAGNOSIS — E7800 Pure hypercholesterolemia, unspecified: Secondary | ICD-10-CM

## 2022-05-29 DIAGNOSIS — I700 Atherosclerosis of aorta: Secondary | ICD-10-CM | POA: Diagnosis not present

## 2022-07-13 DIAGNOSIS — D485 Neoplasm of uncertain behavior of skin: Secondary | ICD-10-CM | POA: Diagnosis not present

## 2022-07-13 DIAGNOSIS — D225 Melanocytic nevi of trunk: Secondary | ICD-10-CM | POA: Diagnosis not present

## 2022-07-13 DIAGNOSIS — Z85828 Personal history of other malignant neoplasm of skin: Secondary | ICD-10-CM | POA: Diagnosis not present

## 2022-07-13 DIAGNOSIS — H524 Presbyopia: Secondary | ICD-10-CM | POA: Diagnosis not present

## 2022-07-13 DIAGNOSIS — H2513 Age-related nuclear cataract, bilateral: Secondary | ICD-10-CM | POA: Diagnosis not present

## 2022-07-13 DIAGNOSIS — D2272 Melanocytic nevi of left lower limb, including hip: Secondary | ICD-10-CM | POA: Diagnosis not present

## 2022-07-13 DIAGNOSIS — L814 Other melanin hyperpigmentation: Secondary | ICD-10-CM | POA: Diagnosis not present

## 2022-07-13 DIAGNOSIS — D692 Other nonthrombocytopenic purpura: Secondary | ICD-10-CM | POA: Diagnosis not present

## 2022-07-13 DIAGNOSIS — L570 Actinic keratosis: Secondary | ICD-10-CM | POA: Diagnosis not present

## 2022-07-13 DIAGNOSIS — L821 Other seborrheic keratosis: Secondary | ICD-10-CM | POA: Diagnosis not present

## 2022-09-23 ENCOUNTER — Ambulatory Visit
Admission: RE | Admit: 2022-09-23 | Discharge: 2022-09-23 | Disposition: A | Discharge status: Home / Self Care | Payer: PPO | Source: Ambulatory Visit | Attending: Internal Medicine | Admitting: Internal Medicine

## 2022-09-23 DIAGNOSIS — Z780 Asymptomatic menopausal state: Secondary | ICD-10-CM | POA: Diagnosis not present

## 2022-09-23 DIAGNOSIS — M8589 Other specified disorders of bone density and structure, multiple sites: Secondary | ICD-10-CM | POA: Diagnosis not present

## 2022-09-23 DIAGNOSIS — M8580 Other specified disorders of bone density and structure, unspecified site: Secondary | ICD-10-CM

## 2022-09-29 DIAGNOSIS — E7800 Pure hypercholesterolemia, unspecified: Secondary | ICD-10-CM | POA: Diagnosis not present

## 2022-09-29 DIAGNOSIS — Z1211 Encounter for screening for malignant neoplasm of colon: Secondary | ICD-10-CM | POA: Diagnosis not present

## 2022-09-29 DIAGNOSIS — N1831 Chronic kidney disease, stage 3a: Secondary | ICD-10-CM | POA: Diagnosis not present

## 2022-09-29 DIAGNOSIS — R7303 Prediabetes: Secondary | ICD-10-CM | POA: Diagnosis not present

## 2022-09-29 DIAGNOSIS — M8589 Other specified disorders of bone density and structure, multiple sites: Secondary | ICD-10-CM | POA: Diagnosis not present

## 2022-09-29 DIAGNOSIS — I10 Essential (primary) hypertension: Secondary | ICD-10-CM | POA: Diagnosis not present

## 2023-03-24 DIAGNOSIS — Z85828 Personal history of other malignant neoplasm of skin: Secondary | ICD-10-CM | POA: Diagnosis not present

## 2023-03-24 DIAGNOSIS — L820 Inflamed seborrheic keratosis: Secondary | ICD-10-CM | POA: Diagnosis not present

## 2023-03-24 DIAGNOSIS — D2272 Melanocytic nevi of left lower limb, including hip: Secondary | ICD-10-CM | POA: Diagnosis not present

## 2023-03-24 DIAGNOSIS — L814 Other melanin hyperpigmentation: Secondary | ICD-10-CM | POA: Diagnosis not present

## 2023-03-24 DIAGNOSIS — L821 Other seborrheic keratosis: Secondary | ICD-10-CM | POA: Diagnosis not present

## 2023-03-24 DIAGNOSIS — L570 Actinic keratosis: Secondary | ICD-10-CM | POA: Diagnosis not present

## 2023-04-29 ENCOUNTER — Ambulatory Visit
Admission: RE | Admit: 2023-04-29 | Discharge: 2023-04-29 | Disposition: A | Discharge status: Home / Self Care | Payer: HMO | Source: Ambulatory Visit | Attending: Internal Medicine | Admitting: Internal Medicine

## 2023-04-29 ENCOUNTER — Other Ambulatory Visit: Payer: Self-pay | Admitting: Internal Medicine

## 2023-04-29 DIAGNOSIS — Z1231 Encounter for screening mammogram for malignant neoplasm of breast: Secondary | ICD-10-CM | POA: Diagnosis not present

## 2023-04-30 DIAGNOSIS — Z0000 Encounter for general adult medical examination without abnormal findings: Secondary | ICD-10-CM | POA: Diagnosis not present

## 2023-04-30 DIAGNOSIS — Z87898 Personal history of other specified conditions: Secondary | ICD-10-CM | POA: Diagnosis not present

## 2023-04-30 DIAGNOSIS — N1831 Chronic kidney disease, stage 3a: Secondary | ICD-10-CM | POA: Diagnosis not present

## 2023-04-30 DIAGNOSIS — I700 Atherosclerosis of aorta: Secondary | ICD-10-CM | POA: Diagnosis not present

## 2023-04-30 DIAGNOSIS — R7303 Prediabetes: Secondary | ICD-10-CM | POA: Diagnosis not present

## 2023-04-30 DIAGNOSIS — M8589 Other specified disorders of bone density and structure, multiple sites: Secondary | ICD-10-CM | POA: Diagnosis not present

## 2023-04-30 DIAGNOSIS — R61 Generalized hyperhidrosis: Secondary | ICD-10-CM | POA: Diagnosis not present

## 2023-04-30 DIAGNOSIS — I10 Essential (primary) hypertension: Secondary | ICD-10-CM | POA: Diagnosis not present

## 2023-04-30 DIAGNOSIS — Z1211 Encounter for screening for malignant neoplasm of colon: Secondary | ICD-10-CM | POA: Diagnosis not present

## 2023-04-30 DIAGNOSIS — Z23 Encounter for immunization: Secondary | ICD-10-CM | POA: Diagnosis not present

## 2023-04-30 DIAGNOSIS — E7800 Pure hypercholesterolemia, unspecified: Secondary | ICD-10-CM | POA: Diagnosis not present

## 2023-07-16 DIAGNOSIS — K5730 Diverticulosis of large intestine without perforation or abscess without bleeding: Secondary | ICD-10-CM | POA: Diagnosis not present

## 2023-07-16 DIAGNOSIS — K9161 Intraoperative hemorrhage and hematoma of a digestive system organ or structure complicating a digestive sytem procedure: Secondary | ICD-10-CM | POA: Diagnosis not present

## 2023-07-16 DIAGNOSIS — K648 Other hemorrhoids: Secondary | ICD-10-CM | POA: Diagnosis not present

## 2023-07-16 DIAGNOSIS — Z1211 Encounter for screening for malignant neoplasm of colon: Secondary | ICD-10-CM | POA: Diagnosis not present

## 2023-07-16 DIAGNOSIS — K6389 Other specified diseases of intestine: Secondary | ICD-10-CM | POA: Diagnosis not present

## 2023-07-19 DIAGNOSIS — H2513 Age-related nuclear cataract, bilateral: Secondary | ICD-10-CM | POA: Diagnosis not present

## 2023-07-19 DIAGNOSIS — H524 Presbyopia: Secondary | ICD-10-CM | POA: Diagnosis not present

## 2023-07-20 DIAGNOSIS — K6389 Other specified diseases of intestine: Secondary | ICD-10-CM | POA: Diagnosis not present

## 2023-07-28 DIAGNOSIS — L814 Other melanin hyperpigmentation: Secondary | ICD-10-CM | POA: Diagnosis not present

## 2023-07-28 DIAGNOSIS — L570 Actinic keratosis: Secondary | ICD-10-CM | POA: Diagnosis not present

## 2023-07-28 DIAGNOSIS — L578 Other skin changes due to chronic exposure to nonionizing radiation: Secondary | ICD-10-CM | POA: Diagnosis not present

## 2023-07-28 DIAGNOSIS — Z85828 Personal history of other malignant neoplasm of skin: Secondary | ICD-10-CM | POA: Diagnosis not present

## 2024-05-04 DIAGNOSIS — M8589 Other specified disorders of bone density and structure, multiple sites: Secondary | ICD-10-CM | POA: Diagnosis not present

## 2024-05-04 DIAGNOSIS — Z87898 Personal history of other specified conditions: Secondary | ICD-10-CM | POA: Diagnosis not present

## 2024-05-04 DIAGNOSIS — Z0000 Encounter for general adult medical examination without abnormal findings: Secondary | ICD-10-CM | POA: Diagnosis not present

## 2024-05-04 DIAGNOSIS — E7800 Pure hypercholesterolemia, unspecified: Secondary | ICD-10-CM | POA: Diagnosis not present

## 2024-05-04 DIAGNOSIS — N1831 Chronic kidney disease, stage 3a: Secondary | ICD-10-CM | POA: Diagnosis not present

## 2024-05-04 DIAGNOSIS — R7303 Prediabetes: Secondary | ICD-10-CM | POA: Diagnosis not present

## 2024-05-04 DIAGNOSIS — I10 Essential (primary) hypertension: Secondary | ICD-10-CM | POA: Diagnosis not present

## 2024-06-28 DIAGNOSIS — L821 Other seborrheic keratosis: Secondary | ICD-10-CM | POA: Diagnosis not present

## 2024-06-28 DIAGNOSIS — L720 Epidermal cyst: Secondary | ICD-10-CM | POA: Diagnosis not present

## 2024-06-28 DIAGNOSIS — L578 Other skin changes due to chronic exposure to nonionizing radiation: Secondary | ICD-10-CM | POA: Diagnosis not present

## 2024-06-28 DIAGNOSIS — L570 Actinic keratosis: Secondary | ICD-10-CM | POA: Diagnosis not present

## 2024-06-28 DIAGNOSIS — Z85828 Personal history of other malignant neoplasm of skin: Secondary | ICD-10-CM | POA: Diagnosis not present

## 2024-06-28 DIAGNOSIS — D225 Melanocytic nevi of trunk: Secondary | ICD-10-CM | POA: Diagnosis not present

## 2024-06-28 DIAGNOSIS — L814 Other melanin hyperpigmentation: Secondary | ICD-10-CM | POA: Diagnosis not present

## 2024-06-28 DIAGNOSIS — D2272 Melanocytic nevi of left lower limb, including hip: Secondary | ICD-10-CM | POA: Diagnosis not present

## 2024-07-25 DIAGNOSIS — H2513 Age-related nuclear cataract, bilateral: Secondary | ICD-10-CM | POA: Diagnosis not present

## 2024-07-25 DIAGNOSIS — H524 Presbyopia: Secondary | ICD-10-CM | POA: Diagnosis not present

## 2024-07-25 DIAGNOSIS — H04123 Dry eye syndrome of bilateral lacrimal glands: Secondary | ICD-10-CM | POA: Diagnosis not present
